# Patient Record
Sex: Male | Born: 1980 | Race: Black or African American | Hispanic: No | Marital: Single | Smoking: Never smoker
Health system: Southern US, Community
[De-identification: ages and names within clinical notes are randomized; demographics above are authoritative.]

## PROBLEM LIST (undated history)

## (undated) HISTORY — PX: FINGER SURGERY: SHX640

---

## 2006-01-23 ENCOUNTER — Emergency Department (HOSPITAL_COMMUNITY): Admission: EM | Admit: 2006-01-23 | Discharge: 2006-01-23 | Payer: Self-pay | Admitting: *Deleted

## 2009-04-15 ENCOUNTER — Emergency Department (HOSPITAL_COMMUNITY): Admission: EM | Admit: 2009-04-15 | Discharge: 2009-04-15 | Payer: Self-pay | Admitting: Family Medicine

## 2009-04-15 ENCOUNTER — Emergency Department (HOSPITAL_COMMUNITY): Admission: EM | Admit: 2009-04-15 | Discharge: 2009-04-16 | Payer: Self-pay | Admitting: Emergency Medicine

## 2010-08-16 ENCOUNTER — Emergency Department (HOSPITAL_COMMUNITY)
Admission: EM | Admit: 2010-08-16 | Discharge: 2010-08-16 | Disposition: A | Discharge status: Home / Self Care | Payer: Self-pay | Attending: Emergency Medicine | Admitting: Emergency Medicine

## 2010-08-16 DIAGNOSIS — R21 Rash and other nonspecific skin eruption: Secondary | ICD-10-CM | POA: Insufficient documentation

## 2010-08-16 DIAGNOSIS — I10 Essential (primary) hypertension: Secondary | ICD-10-CM | POA: Insufficient documentation

## 2010-08-16 DIAGNOSIS — R079 Chest pain, unspecified: Secondary | ICD-10-CM | POA: Insufficient documentation

## 2010-08-16 DIAGNOSIS — T7840XA Allergy, unspecified, initial encounter: Secondary | ICD-10-CM | POA: Insufficient documentation

## 2018-12-18 ENCOUNTER — Encounter (HOSPITAL_COMMUNITY): Payer: Self-pay

## 2018-12-18 ENCOUNTER — Other Ambulatory Visit: Payer: Self-pay

## 2018-12-18 ENCOUNTER — Emergency Department (HOSPITAL_COMMUNITY): Payer: Self-pay

## 2018-12-18 DIAGNOSIS — Y92513 Shop (commercial) as the place of occurrence of the external cause: Secondary | ICD-10-CM | POA: Insufficient documentation

## 2018-12-18 DIAGNOSIS — W268XXA Contact with other sharp object(s), not elsewhere classified, initial encounter: Secondary | ICD-10-CM | POA: Insufficient documentation

## 2018-12-18 DIAGNOSIS — Y999 Unspecified external cause status: Secondary | ICD-10-CM | POA: Insufficient documentation

## 2018-12-18 DIAGNOSIS — Y939 Activity, unspecified: Secondary | ICD-10-CM | POA: Insufficient documentation

## 2018-12-18 DIAGNOSIS — S81811A Laceration without foreign body, right lower leg, initial encounter: Secondary | ICD-10-CM | POA: Insufficient documentation

## 2018-12-18 DIAGNOSIS — Z23 Encounter for immunization: Secondary | ICD-10-CM | POA: Insufficient documentation

## 2018-12-18 NOTE — ED Triage Notes (Signed)
Pt presents to ED with laceration to right shin. Pt states he cut his leg on a car at the auction. Last TDaP unknown. Dressing applied in triage.

## 2018-12-19 ENCOUNTER — Emergency Department (HOSPITAL_COMMUNITY)
Admission: EM | Admit: 2018-12-19 | Discharge: 2018-12-19 | Disposition: A | Discharge status: Home / Self Care | Payer: Self-pay | Attending: Emergency Medicine | Admitting: Emergency Medicine

## 2018-12-19 DIAGNOSIS — S81811A Laceration without foreign body, right lower leg, initial encounter: Secondary | ICD-10-CM

## 2018-12-19 MED ORDER — LIDOCAINE HCL (PF) 1 % IJ SOLN
INTRAMUSCULAR | Status: AC
  Filled 2018-12-19: qty 4

## 2018-12-19 MED ORDER — TETANUS-DIPHTH-ACELL PERTUSSIS 5-2.5-18.5 LF-MCG/0.5 IM SUSP
0.5000 mL | Freq: Once | INTRAMUSCULAR | Status: AC
  Administered 2018-12-19: 03:00:00 0.5 mL via INTRAMUSCULAR
  Filled 2018-12-19: qty 0.5

## 2018-12-19 MED ORDER — LIDOCAINE-EPINEPHRINE (PF) 2 %-1:200000 IJ SOLN
20.0000 mL | Freq: Once | INTRAMUSCULAR | Status: AC
  Administered 2018-12-19: 20 mL
  Filled 2018-12-19: qty 20

## 2018-12-19 MED ORDER — POVIDONE-IODINE 10 % EX SOLN
CUTANEOUS | Status: AC
  Filled 2018-12-19: qty 45

## 2018-12-19 NOTE — ED Provider Notes (Signed)
[facility] Provider Note   CSN: [id number] Arrival date & time: 12/18/18  2221     History   Chief Complaint Chief Complaint  Patient presents with  . Laceration    HPI Jared Jomarie LongsJoseph is a 38 y.o. male.     Patient sustained laceration to his right lateral lower leg from a metal door at a car auction.  He denies any weakness, numbness or tingling.  Unknown last tetanus.  Bleeding has been controlled.  Denies any chest pain or shortness of breath. No fever, chills, nausea or vomiting.  The history is provided by the patient.  Laceration Associated symptoms: no fever     History reviewed. No pertinent past medical history.  There are no active problems to display for this patient.   Past Surgical History:  Procedure Laterality Date  . FINGER SURGERY          Home Medications    Prior to Admission medications   Not on File    Family History No family history on file.  Social History Social History   Tobacco Use  . Smoking status: Never Smoker  . Smokeless tobacco: Never Used  Substance Use Topics  . Alcohol use: Not Currently  . Drug use: Never     Allergies   Patient has no known allergies.   Review of Systems Review of Systems  Constitutional: Negative for activity change, appetite change and fever.  HENT: Negative for congestion and rhinorrhea.   Respiratory: Negative for cough, chest tightness and shortness of breath.   Cardiovascular: Negative for chest pain.  Gastrointestinal: Negative for abdominal pain, nausea and vomiting.  Genitourinary: Negative for dysuria and hematuria.  Musculoskeletal: Positive for arthralgias and myalgias.  Skin: Positive for wound.  Neurological: Negative for dizziness, weakness and headaches.   all other systems are negative except as noted in the HPI and PMH.     Physical Exam Updated Vital Signs BP (!) 154/109 (BP Location: Right Arm)   Pulse 72   Temp 98 F (36.7 C) (Oral)    Resp 18   Ht 6\' 2"  (1.88 m)   Wt 80.7 kg   SpO2 99%   BMI 22.85 kg/m   Physical Exam Vitals signs and nursing note reviewed.  Constitutional:      General: He is not in acute distress.    Appearance: He is well-developed.  HENT:     Head: Normocephalic and atraumatic.     Mouth/Throat:     Pharynx: No oropharyngeal exudate.  Eyes:     Conjunctiva/sclera: Conjunctivae normal.     Pupils: Pupils are equal, round, and reactive to light.  Neck:     Musculoskeletal: Normal range of motion and neck supple.     Comments: No meningismus. Cardiovascular:     Rate and Rhythm: Normal rate and regular rhythm.     Heart sounds: Normal heart sounds. No murmur.  Pulmonary:     Effort: Pulmonary effort is normal. No respiratory distress.     Breath sounds: Normal breath sounds.  Abdominal:     Palpations: Abdomen is soft.     Tenderness: There is no abdominal tenderness. There is no guarding or rebound.  Musculoskeletal: Normal range of motion.        General: Tenderness, deformity and signs of injury present.     Comments: 8 centimeter laceration to right lateral lower leg as depicted. Full flexion extension of knee, ankle.  Intact DP and PT pulses. Able to raise leg  and knee extended. Wound does not appear to involve the joint space.  Skin:    General: Skin is warm.     Capillary Refill: Capillary refill takes less than 2 seconds.  Neurological:     General: No focal deficit present.     Mental Status: He is alert and oriented to person, place, and time. Mental status is at baseline.     Cranial Nerves: No cranial nerve deficit.     Motor: No abnormal muscle tone.     Coordination: Coordination normal.     Comments: No ataxia on finger to nose bilaterally. No pronator drift. 5/5 strength throughout. CN 2-12 intact.Equal grip strength. Sensation intact.   Psychiatric:        Behavior: Behavior normal.        ED Treatments / Results  Labs (all labs ordered are listed, but  only abnormal results are displayed) Labs Reviewed - No data to display  EKG None  Radiology Dg Tibia/fibula Right  Result Date: 12/18/2018 CLINICAL DATA:  Laceration EXAM: RIGHT TIBIA AND FIBULA - 2 VIEW COMPARISON:  None. FINDINGS: There is no evidence of fracture or other focal bone lesions. No radiopaque foreign body in the soft tissues IMPRESSION: No acute osseous abnormality Electronically Signed   By: Jasmine Pang M.D.   On: 12/18/2018 23:12    Procedures .Marland KitchenLaceration Repair  Date/Time: 12/19/2018 3:10 AM Performed by: Glynn Octave, MD Authorized by: Glynn Octave, MD   Consent:    Consent obtained:  Verbal   Consent given by:  Patient   Risks discussed:  Infection, need for additional repair, nerve damage, poor wound healing, vascular damage, tendon damage, retained foreign body, pain and poor cosmetic result   Alternatives discussed:  Delayed treatment and no treatment Anesthesia (see MAR for exact dosages):    Anesthesia method:  Local infiltration   Local anesthetic:  Lidocaine 2% WITH epi Laceration details:    Location:  Leg   Leg location:  R lower leg   Length (cm):  8 Repair type:    Repair type:  Complex Pre-procedure details:    Preparation:  Imaging obtained to evaluate for foreign bodies and patient was prepped and draped in usual sterile fashion Exploration:    Hemostasis achieved with:  Epinephrine and direct pressure   Wound exploration: wound explored through full range of motion and entire depth of wound probed and visualized     Wound extent: vascular damage     Wound extent: no muscle damage noted, no nerve damage noted, no tendon damage noted and no underlying fracture noted   Treatment:    Area cleansed with:  Betadine   Amount of cleaning:  Extensive   Irrigation solution:  Sterile saline   Irrigation method:  Syringe   Visualized foreign bodies/material removed: no   Skin repair:    Repair method:  Sutures   Suture size:  4-0    Suture material:  Prolene   Suture technique:  Horizontal mattress   Number of sutures:  7 (3 horizontal mattress, 4 simple interrupted) Approximation:    Approximation:  Close Post-procedure details:    Dressing:  Antibiotic ointment and adhesive bandage   Patient tolerance of procedure:  Tolerated well, no immediate complications   (including critical care time)  Medications Ordered in ED Medications  lidocaine-EPINEPHrine (XYLOCAINE W/EPI) 2 %-1:200000 (PF) injection 20 mL (has no administration in time range)  Tdap (BOOSTRIX) injection 0.5 mL (has no administration in time range)  povidone-iodine (BETADINE) 10 %  external solution (has no administration in time range)     Initial Impression / Assessment and Plan / ED Course  I have reviewed the triage vital signs and the nursing notes.  Pertinent labs & imaging results that were available during my care of the patient were reviewed by me and considered in my medical decision making (see chart for details).       Laceration from a metal piece as above.  No evidence of neurovascular deficit.  Flexion, extension intact.  Distal pulses intact.  Does not appear to involve joint space.  X-rays negative.  Tetanus updated.  Laceration repaired as above.  Patient instructed on local wound care.  Tetanus is updated.  His x-ray is negative for foreign body.  Follow-up with PCP for suture removal in 7-10 days.  Return precautions discussed.  Final Clinical Impressions(s) / ED Diagnoses   Final diagnoses:  Laceration of right lower extremity, initial encounter    ED Discharge Orders    None       Almetta Liddicoat, Annie Main, MD 12/19/18 [phone]

## 2018-12-19 NOTE — ED Notes (Signed)
Suture cart, lidocaine and betadine at bedside

## 2018-12-19 NOTE — ED Notes (Signed)
Laceration irrigated.

## 2018-12-19 NOTE — Discharge Instructions (Signed)
Keep the wound clean and dry.  Follow-up for suture removal in 1 week.  Return to the ED with worsening pain, redness, weakness, numbness, tingling, or other concerns.

## 2020-06-14 IMAGING — DX RIGHT TIBIA AND FIBULA - 2 VIEW
4 series · 4 of 4 positions shown · non-contrast
Comparison: None.

CLINICAL DATA: Laceration

EXAM:
RIGHT TIBIA AND FIBULA - 2 VIEW

[tibia ap (1 of 2)]
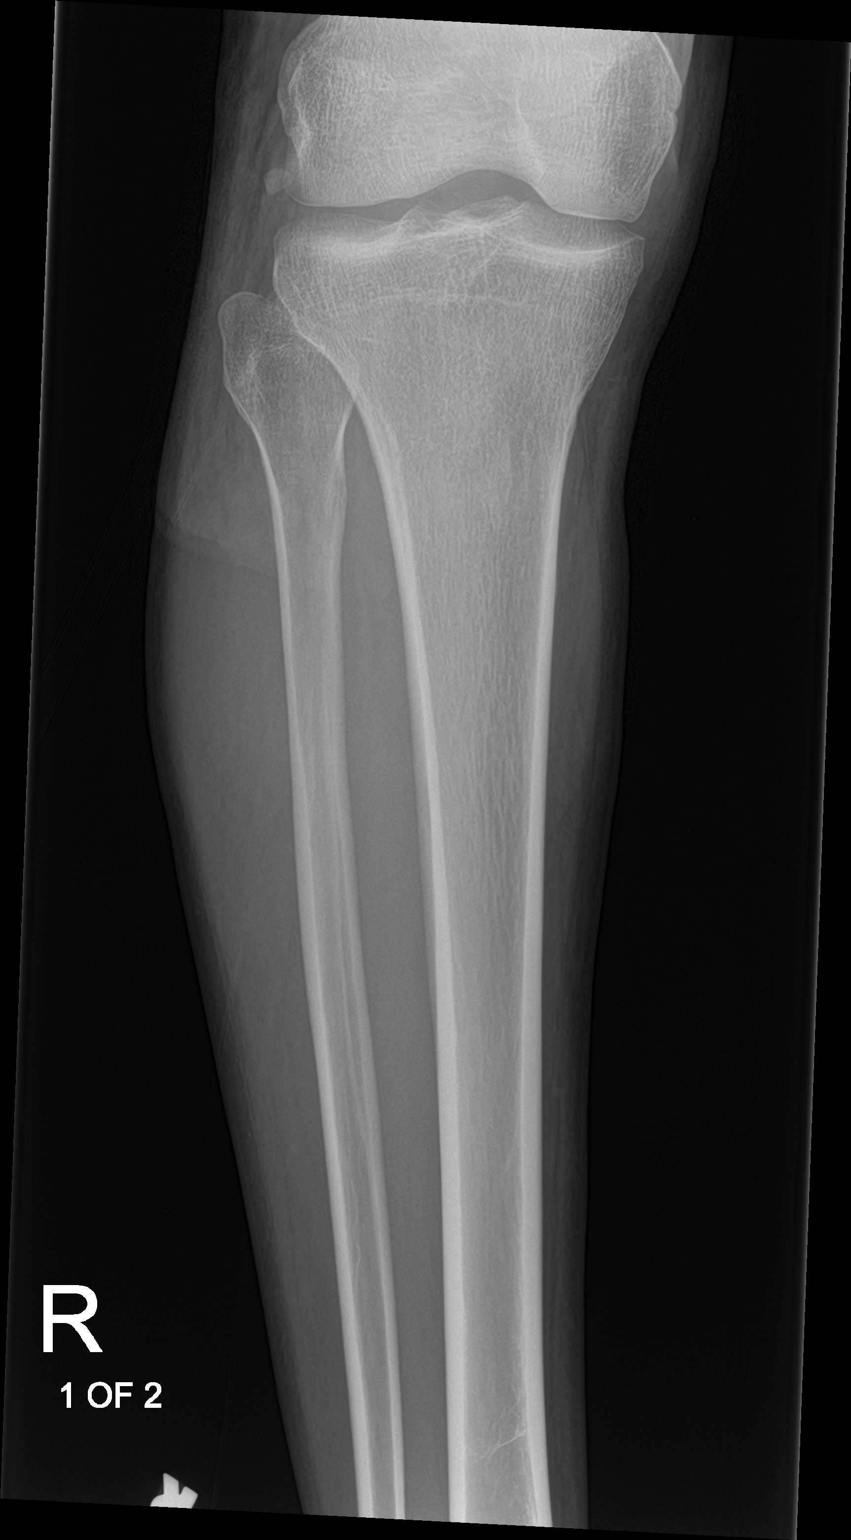

[tibia ap (2 of 2)]
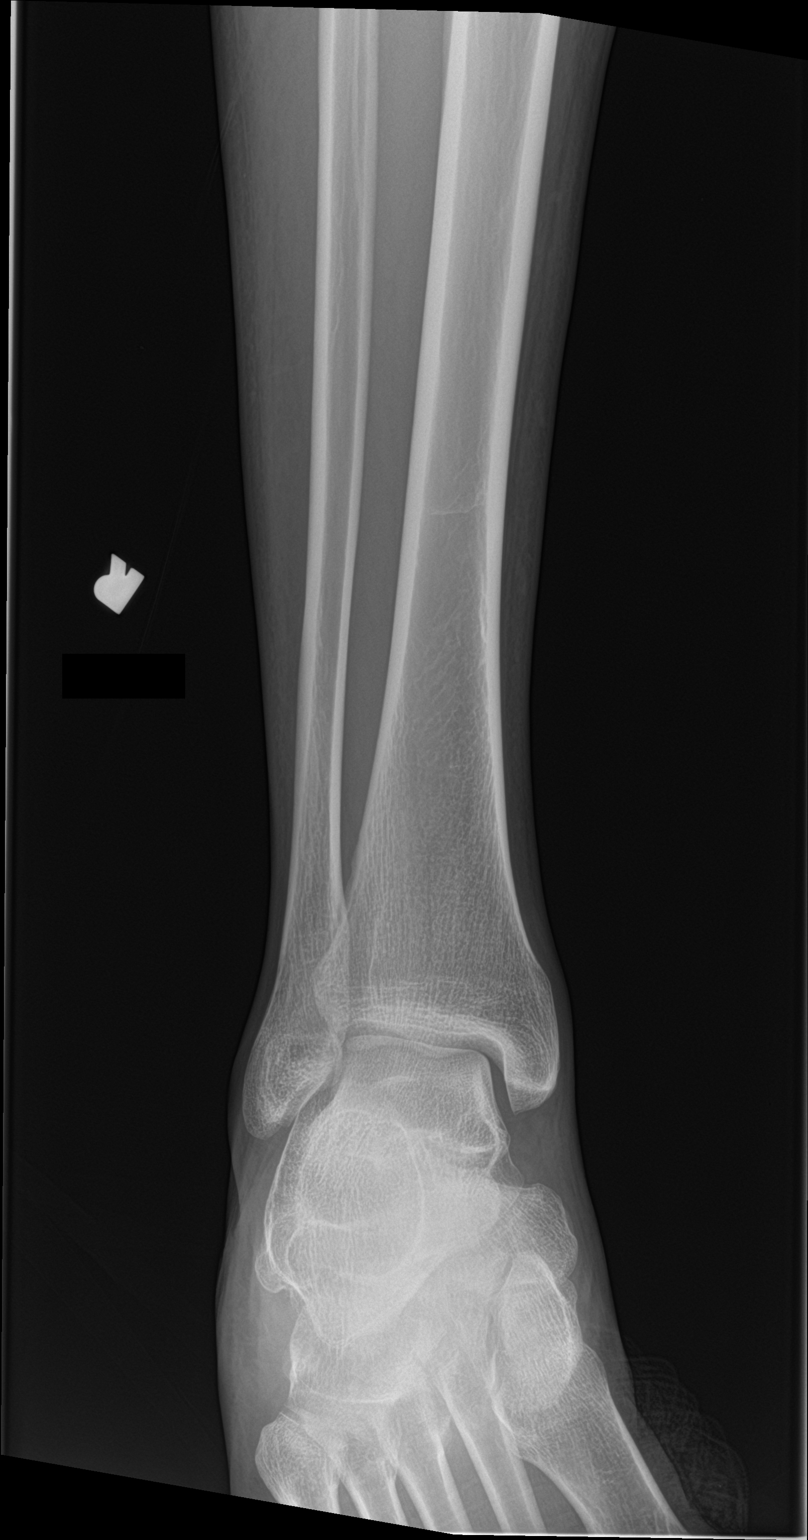

[tibia lat (1 of 2)]
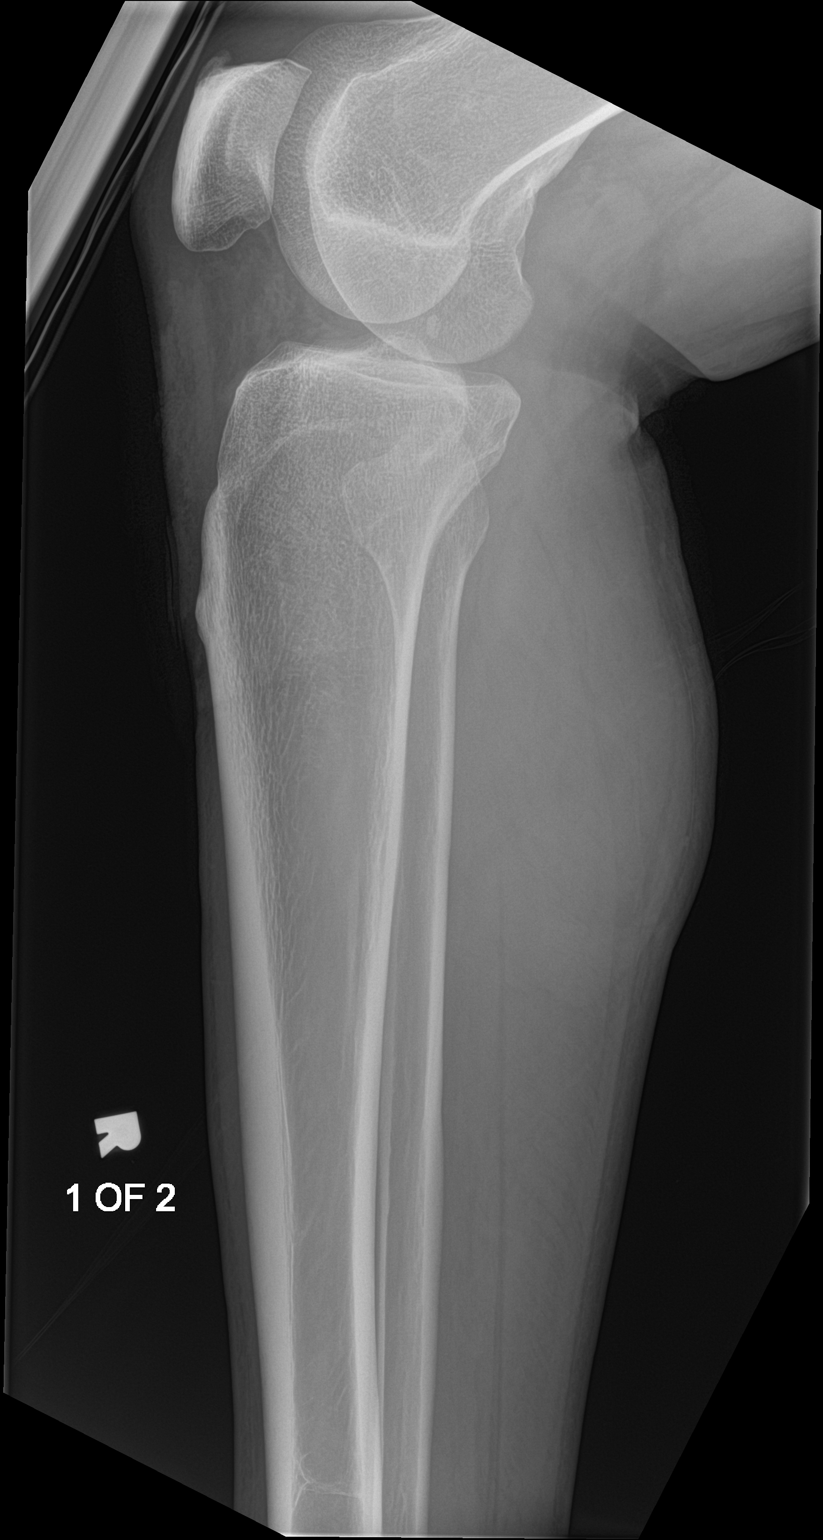

[tibia lat (2 of 2)]
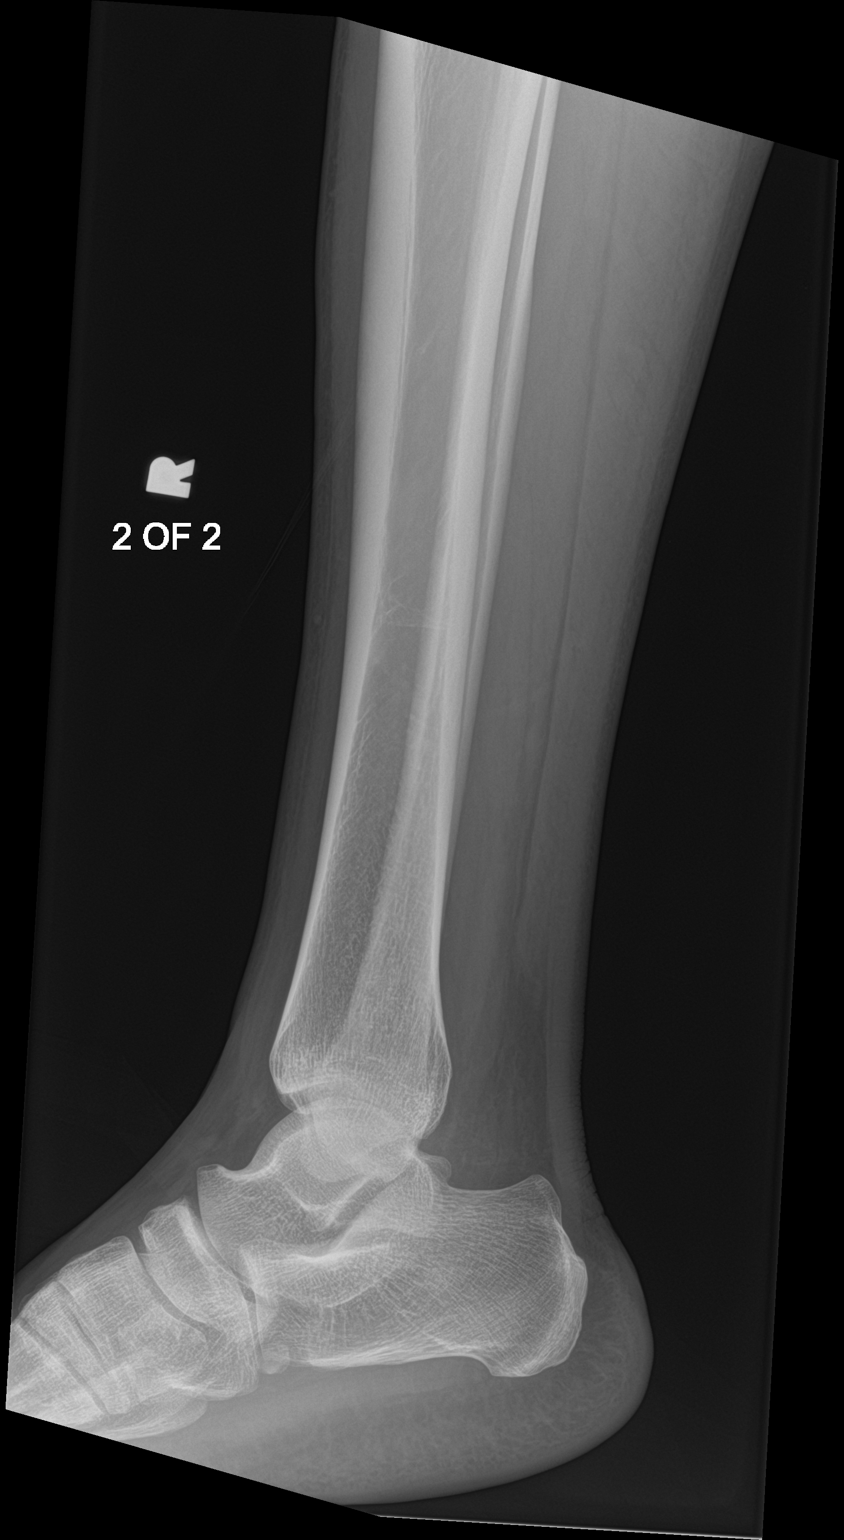

[4 of 4 positions shown; findings below may reference images not displayed]

FINDINGS: There is no evidence of fracture or other focal bone lesions. No
radiopaque foreign body in the soft tissues
IMPRESSION: No acute osseous abnormality

## 2023-04-26 DIAGNOSIS — Z419 Encounter for procedure for purposes other than remedying health state, unspecified: Secondary | ICD-10-CM | POA: Diagnosis not present

## 2023-05-27 DIAGNOSIS — Z419 Encounter for procedure for purposes other than remedying health state, unspecified: Secondary | ICD-10-CM | POA: Diagnosis not present

## 2023-06-24 DIAGNOSIS — Z419 Encounter for procedure for purposes other than remedying health state, unspecified: Secondary | ICD-10-CM | POA: Diagnosis not present

## 2023-08-05 DIAGNOSIS — Z419 Encounter for procedure for purposes other than remedying health state, unspecified: Secondary | ICD-10-CM | POA: Diagnosis not present

## 2023-09-04 DIAGNOSIS — Z419 Encounter for procedure for purposes other than remedying health state, unspecified: Secondary | ICD-10-CM | POA: Diagnosis not present

## 2023-10-05 DIAGNOSIS — Z419 Encounter for procedure for purposes other than remedying health state, unspecified: Secondary | ICD-10-CM | POA: Diagnosis not present

## 2023-11-04 DIAGNOSIS — Z419 Encounter for procedure for purposes other than remedying health state, unspecified: Secondary | ICD-10-CM | POA: Diagnosis not present

## 2023-12-05 DIAGNOSIS — Z419 Encounter for procedure for purposes other than remedying health state, unspecified: Secondary | ICD-10-CM | POA: Diagnosis not present

## 2024-01-05 DIAGNOSIS — Z419 Encounter for procedure for purposes other than remedying health state, unspecified: Secondary | ICD-10-CM | POA: Diagnosis not present

## 2024-02-04 DIAGNOSIS — Z419 Encounter for procedure for purposes other than remedying health state, unspecified: Secondary | ICD-10-CM | POA: Diagnosis not present
